# Patient Record
Sex: Male | Born: 1980 | Race: White | Hispanic: No | Smoking: Current every day smoker
Health system: Southern US, Community
[De-identification: ages and names within clinical notes are randomized; demographics above are authoritative.]

## PROBLEM LIST (undated history)

## (undated) DIAGNOSIS — F32A Depression, unspecified: Secondary | ICD-10-CM

## (undated) DIAGNOSIS — F329 Major depressive disorder, single episode, unspecified: Secondary | ICD-10-CM

## (undated) DIAGNOSIS — K589 Irritable bowel syndrome without diarrhea: Secondary | ICD-10-CM

## (undated) DIAGNOSIS — F419 Anxiety disorder, unspecified: Secondary | ICD-10-CM

## (undated) HISTORY — DX: Major depressive disorder, single episode, unspecified: F32.9

## (undated) HISTORY — DX: Irritable bowel syndrome, unspecified: K58.9

## (undated) HISTORY — DX: Anxiety disorder, unspecified: F41.9

## (undated) HISTORY — DX: Depression, unspecified: F32.A

---

## 2008-07-23 ENCOUNTER — Ambulatory Visit: Payer: Self-pay | Admitting: Internal Medicine

## 2009-02-16 ENCOUNTER — Emergency Department: Payer: Self-pay | Admitting: Emergency Medicine

## 2009-02-20 ENCOUNTER — Emergency Department: Payer: Self-pay | Admitting: Emergency Medicine

## 2009-02-22 ENCOUNTER — Ambulatory Visit: Payer: Self-pay | Admitting: Anesthesiology

## 2016-05-31 ENCOUNTER — Ambulatory Visit: Payer: Self-pay | Admitting: Family Medicine

## 2016-06-12 ENCOUNTER — Encounter: Payer: Self-pay | Admitting: Family Medicine

## 2016-06-12 ENCOUNTER — Telehealth: Payer: Self-pay | Admitting: Family Medicine

## 2016-06-12 ENCOUNTER — Ambulatory Visit (INDEPENDENT_AMBULATORY_CARE_PROVIDER_SITE_OTHER): Payer: Managed Care, Other (non HMO) | Admitting: Family Medicine

## 2016-06-12 ENCOUNTER — Ambulatory Visit
Admission: RE | Admit: 2016-06-12 | Discharge: 2016-06-12 | Disposition: A | Discharge status: Home / Self Care | Payer: Managed Care, Other (non HMO) | Source: Ambulatory Visit | Attending: Family Medicine | Admitting: Family Medicine

## 2016-06-12 VITALS — BP 128/72 | HR 106 | Temp 99.1°F | Ht 72.0 in | Wt 160.8 lb

## 2016-06-12 DIAGNOSIS — F419 Anxiety disorder, unspecified: Secondary | ICD-10-CM | POA: Diagnosis not present

## 2016-06-12 DIAGNOSIS — S0990XA Unspecified injury of head, initial encounter: Secondary | ICD-10-CM

## 2016-06-12 DIAGNOSIS — F1910 Other psychoactive substance abuse, uncomplicated: Secondary | ICD-10-CM | POA: Diagnosis not present

## 2016-06-12 DIAGNOSIS — F32A Depression, unspecified: Secondary | ICD-10-CM | POA: Insufficient documentation

## 2016-06-12 DIAGNOSIS — K589 Irritable bowel syndrome without diarrhea: Secondary | ICD-10-CM | POA: Insufficient documentation

## 2016-06-12 DIAGNOSIS — S0003XA Contusion of scalp, initial encounter: Secondary | ICD-10-CM | POA: Diagnosis not present

## 2016-06-12 DIAGNOSIS — F329 Major depressive disorder, single episode, unspecified: Secondary | ICD-10-CM | POA: Diagnosis not present

## 2016-06-12 DIAGNOSIS — K580 Irritable bowel syndrome with diarrhea: Secondary | ICD-10-CM

## 2016-06-12 DIAGNOSIS — X58XXXA Exposure to other specified factors, initial encounter: Secondary | ICD-10-CM | POA: Insufficient documentation

## 2016-06-12 DIAGNOSIS — F1911 Other psychoactive substance abuse, in remission: Secondary | ICD-10-CM | POA: Insufficient documentation

## 2016-06-12 MED ORDER — SERTRALINE HCL 50 MG PO TABS: 25.0000 mg | ORAL_TABLET | Freq: Every day | ORAL | 3 refills | Status: DC

## 2016-06-12 NOTE — Assessment & Plan Note (Signed)
Patient is abusing inhalants. We will evaluate his head injury first and then we'll need to discuss potential treatment for this issue.

## 2016-06-12 NOTE — Patient Instructions (Signed)
Nice to see you. We'll start you on Zoloft for depression and anxiety. If you develop thoughts of harming yourself you need to seek medical attention immediately. We'll try and get a CT scan of your head today. We will contact you with the results. If you develop headaches, drowsiness, vision changes, numbness or weakness, or any new or changing symptoms please seek medical attention medially.

## 2016-06-12 NOTE — Assessment & Plan Note (Signed)
Diarrhea predominant. Advised to continue to monitor his diet. Monitor for progression of symptoms. Zoloft may be beneficial.

## 2016-06-12 NOTE — Telephone Encounter (Signed)
Spoke with patient regarding CT scan results. No skull fractures noted. No intracranial abnormalities. There were 2 hematomas noted within his scalp. Discussed that this may take several weeks to a month or so to resolve completely. Also discussed whether or not he wanted any help with his inhalant use. He notes he has good support in place and is starting to get a handle on it. He'll let us know if he desires any help from [place].

## 2016-06-12 NOTE — Progress Notes (Signed)
Marikay Alar, MD Phone: [phone]  Alexander Kramer is a 36 y.o. male who presents today for new patient visit.  Depression/anxiety: Patient notes he recently underwent a separation from his wife. She cheated on him and he found some fairly disturbing messages. Had to leave the home. He is staying with a friend and is now staying with his mother. Notes his sleep was disturbed initially though he is sleeping better. He just feels worthless. He notes no SI. He has a therapy appointment next week and did 3 therapy visits through his work. In the past he's been on Wellbutrin and possibly Prozac though both of these made him jittery.  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 It's not as bad as it used to be. He's made some dietary changes. It really depends on what he eats. No blood in his stool. Rare nausea. Rare constipation.  Patient notes Friday of last week he was bent over at work and was startled by a customer and hit the right anterior portion of his head on his desk. He had a knot in this area and then over the next 2-3 days developed significant bruising around his eyes bilaterally right greater than left. He reports there was no loss of consciousness. He has not had any headaches, vision changes, numbness, or weakness since then. He also reports he has been abusing inhalants and is unable to remember anything when he does this. On several occasions he has reportedly fallen and hit his head and does not remember anything from this. His mother describes a possible drawing of the left side of his face after using this. They note several times he has hit his head when falling after using inhalants. He denies any drainage from his nose.  Active Ambulatory Problems    Diagnosis Date Noted  . Anxiety and depression 06/12/2016  . Head injury 06/12/2016  . IBS (irritable bowel syndrome) 06/12/2016  . Drug abuse 06/12/2016   Resolved Ambulatory Problems    Diagnosis Date Noted  . No  Resolved Ambulatory Problems   Past Medical History:  Diagnosis Date  . Anxiety   . Depression   . IBS (irritable bowel syndrome)     Family History  Problem Relation Age of Onset  . Depression Father   . Depression Sister   . Prostate cancer Paternal Uncle   . Prostate cancer Paternal Grandfather   . Depression Sister     Social History   Social History  . Marital status: Single    Spouse name: N/A  . Number of children: N/A  . Years of education: N/A   Occupational History  . Not on file.   Social History Main Topics  . Smoking status: Current Every Day Smoker  . Smokeless tobacco: Never Used  . Alcohol use No  . Drug use: Yes  . Sexual activity: Not on file   Other Topics Concern  . Not on file   Social History Narrative  . No narrative on file    ROS  General:  Negative for nexplained weight loss, fever Skin: Negative for new or changing mole, sore that won't heal HEENT: Negative for trouble hearing, trouble seeing, ringing in ears, mouth sores, hoarseness, change in voice, dysphagia. CV:  Negative for chest pain, dyspnea, edema, palpitations Resp: Negative for cough, dyspnea, hemoptysis GI: Positive for nausea, diarrhea Negative for vomiting, constipation, abdominal pain, melena, hematochezia. GU: Negative for dysuria, incontinence, urinary hesitance, hematuria, vaginal or penile discharge, polyuria, sexual difficulty, lumps  in testicle or breasts MSK: Negative for muscle cramps or aches, joint pain or swelling Neuro: Negative for headaches, weakness, numbness, dizziness, passing out/fainting Psych: Positive for depression, anxiety, negative for memory problems  Objective  Physical Exam Vitals:   06/12/16 1542  BP: 128/72  Pulse: (!) 106  Temp: 99.1 F (37.3 C)    BP Readings from Last 3 Encounters:  06/12/16 128/72   Wt Readings from Last 3 Encounters:  06/12/16 160 lb 12.8 oz (72.9 kg)    Physical Exam  Constitutional: No distress.    HENT:  Head:    Mouth/Throat: Oropharynx is clear and moist. No oropharyngeal exudate.  Patient with ecchymoses under both eyes right greater than left, he has preauricular and posterior auricular ecchymoses as well, nontender in all of these areas, no bony step off of the brow or forehead, no bony step off of the eye sockets or nose, no tenderness of the postauricular area, no hemotympanum  Eyes: EOM are normal. Pupils are equal, round, and reactive to light.  Small right eye lateral subconjunctival hemorrhage noted  Neck: Neck supple.  Cardiovascular: Normal rate, regular rhythm and normal heart sounds.   Pulmonary/Chest: Effort normal and breath sounds normal.  Abdominal: Soft. Bowel sounds are normal. He exhibits no distension. There is no tenderness. There is no rebound and no guarding.  Musculoskeletal: He exhibits no edema.  Lymphadenopathy:    He has no cervical adenopathy.  Neurological: He is alert. Gait normal.  Skin: Skin is warm and dry. He is not diaphoretic.  Psychiatric:  Mood depressed, affect flat     Assessment/Plan:   Anxiety and depression Patient with significant anxiety and depression. Somewhat reactive to recent separation and difficulties with his wife. Discussed that he would benefit from therapy and he should proceed with that. Discussed medication options and we will try Zoloft. Given that he has developed some jitteriness with other SSRIs we will start at the lowest dose of 25 mg. We'll see him back in 4 weeks. Given return precautions.  Head injury Patient with head injury about 5 days ago and possible injuries since then with falling after inhalant use. Concern given findings of postauricular ecchymosis and periorbital ecchymosis would be for skull fracture. Given this and his repeated falls we will obtain a CT scan of his head to evaluate this. Discussed that if he does have a skull fracture he would likely need to go to the emergency room for evaluation.  Discussed that if he develops any headache, vision changes, numbness, weakness, nausea, vomiting, or any new symptoms he should go to the emergency room for evaluation as well.  IBS (irritable bowel syndrome) Diarrhea predominant. Advised to continue to monitor his diet. Monitor for progression of symptoms. Zoloft may be beneficial.  Drug abuse Patient is abusing inhalants. We will evaluate his head injury first and then we'll need to discuss potential treatment for this issue.   Orders Placed This Encounter  Procedures  . CT Head Wo Contrast    Standing Status:   Future    Number of Occurrences:   1    Standing Expiration Date:   09/12/2017    Order Specific Question:   Reason for Exam (SYMPTOM  OR DIAGNOSIS REQUIRED)    Answer:   head injury friday, multiple falls since then, large hematoma right forehead, ecchymosis postauricular area    Order Specific Question:   Preferred imaging location?    Answer:   [facility]    Order Specific Question:  Call Results- Best Contact Number?    Answer:   [phone], hold pt    Order Specific Question:   Radiology Contrast Protocol - do NOT remove file path    Answer:   [email or web address]    Marikay Alar, MD [facility] Primary [facility]

## 2016-06-12 NOTE — Assessment & Plan Note (Signed)
Patient with significant anxiety and depression. Somewhat reactive to recent separation and difficulties with his wife. Discussed that he would benefit from therapy and he should proceed with that. Discussed medication options and we will try Zoloft. Given that he has developed some jitteriness with other SSRIs we will start at the lowest dose of 25 mg. We'll see him back in 4 weeks. Given return precautions.

## 2016-06-12 NOTE — Progress Notes (Signed)
Pre visit review using our clinic review tool, if applicable. No additional management support is needed unless otherwise documented below in the visit note. 

## 2016-06-12 NOTE — Assessment & Plan Note (Signed)
Patient with head injury about 5 days ago and possible injuries since then with falling after inhalant use. Concern given findings of postauricular ecchymosis and periorbital ecchymosis would be for skull fracture. Given this and his repeated falls we will obtain a CT scan of his head to evaluate this. Discussed that if he does have a skull fracture he would likely need to go to the emergency room for evaluation. Discussed that if he develops any headache, vision changes, numbness, weakness, nausea, vomiting, or any new symptoms he should go to the emergency room for evaluation as well.

## 2016-06-19 ENCOUNTER — Telehealth: Payer: Self-pay | Admitting: Family Medicine

## 2016-06-19 NOTE — Telephone Encounter (Signed)
Pt dropped off disability form to be filled out.. Placed in Dr. Birdie SonsSonnenberg color folder upfront. Please advise

## 2016-06-21 NOTE — Telephone Encounter (Signed)
In Dr.Sonnenbergs green form folder to fill out 

## 2016-06-25 NOTE — Telephone Encounter (Signed)
Please advise 

## 2016-06-25 NOTE — Telephone Encounter (Signed)
Pt called to follow up on the paper work he dropped off. Thank you!  Call pt @ [phone].

## 2016-07-03 NOTE — Telephone Encounter (Signed)
Form completed. Please fill in physician information and then I will sign. Thanks.

## 2016-07-04 DIAGNOSIS — Z7689 Persons encountering health services in other specified circumstances: Secondary | ICD-10-CM

## 2016-07-04 NOTE — Telephone Encounter (Signed)
Patient notified form will be faxed today, patient also requested a copy to be faxed to him as well

## 2016-07-05 ENCOUNTER — Ambulatory Visit: Payer: Self-pay | Admitting: Family Medicine

## 2016-07-15 ENCOUNTER — Emergency Department
Admission: EM | Admit: 2016-07-15 | Discharge: 2016-07-15 | Disposition: A | Discharge status: Home / Self Care | Payer: Managed Care, Other (non HMO) | Attending: Emergency Medicine | Admitting: Emergency Medicine

## 2016-07-15 ENCOUNTER — Encounter: Payer: Self-pay | Admitting: *Deleted

## 2016-07-15 DIAGNOSIS — F17200 Nicotine dependence, unspecified, uncomplicated: Secondary | ICD-10-CM | POA: Insufficient documentation

## 2016-07-15 DIAGNOSIS — F1910 Other psychoactive substance abuse, uncomplicated: Secondary | ICD-10-CM | POA: Insufficient documentation

## 2016-07-15 DIAGNOSIS — F1810 Inhalant abuse, uncomplicated: Secondary | ICD-10-CM | POA: Diagnosis present

## 2016-07-15 NOTE — ED Notes (Signed)

## 2016-07-15 NOTE — ED Provider Notes (Signed)
[facility] Emergency Department Provider Note  Time seen: 12:22 PM  I have reviewed the triage vital signs and the nursing notes.   HISTORY  Chief Complaint Toxic Inhalation    HPI Alexander JewelBobby J Munce is a 36 y.o. male with a past medical history of anxiety, depression, presents to the emergency department after being found huffing an air duster.According to the patient he wanted to get a "buzz" this morning so he used an air duster. Patient states he has done this to her 3 times in the past. He states today he locked his office at work attempted to use the air duster to get high and fell asleep. The patient does admit some depression has a therapist which he sees. But denies any SI or HI. Denies any attempt to harm himself. He states he just did too much and must fall asleep so he could not answer the door. Patient states he has done this in the past. Denies any other substances used.  Past Medical History:  Diagnosis Date  . Anxiety   . Depression   . IBS (irritable bowel syndrome)     Patient Active Problem List   Diagnosis Date Noted  . Anxiety and depression 06/12/2016  . Head injury 06/12/2016  . IBS (irritable bowel syndrome) 06/12/2016  . Drug abuse 06/12/2016    History reviewed. No pertinent surgical history.  Prior to Admission medications   Medication Sig Start Date End Date Taking? Authorizing Provider  ibuprofen (ADVIL,MOTRIN) 200 MG tablet Take 200 mg by mouth every 6 (six) hours as needed.    [provider]  sertraline (ZOLOFT) 50 MG tablet Take 0.5 tablets (25 mg total) by mouth daily. 06/12/16   Glori LuisSonnenberg, Eric G, MD    No Known Allergies  Family History  Problem Relation Age of Onset  . Depression Father   . Depression Sister   . Prostate cancer Paternal Uncle   . Prostate cancer Paternal Grandfather   . Depression Sister     Social History Social History  Substance Use Topics  . Smoking status: Current Every Day  Smoker  . Smokeless tobacco: Never Used  . Alcohol use No    Review of Systems Constitutional: Negative for fever. Eyes: Negative for visual changes. ENT: Negative for congestion Cardiovascular: Negative for chest pain. Respiratory: Negative for shortness of breath. Gastrointestinal: Negative for abdominal pain Musculoskeletal: Negative for back pain. Neurological: Negative for headache All other ROS negative  ____________________________________________   PHYSICAL EXAM:  VITAL SIGNS: ED Triage Vitals [07/15/16 1123]  Enc Vitals Group     BP (!) 144/92     Pulse Rate (!) 114     Resp 16     Temp 97.8 F (36.6 C)     Temp Source Oral     SpO2 96 %     Weight 160 lb (72.6 kg)     Height 6' (1.829 m)     Head Circumference      Peak Flow      Pain Score      Pain Loc      Pain Edu?      Excl. in GC?     Constitutional: Alert and oriented. Well appearing and in no distress. Eyes: Normal exam ENT   Head: Normocephalic and atraumatic.   Mouth/Throat: Mucous membranes are moist. Cardiovascular: Normal rate, regular rhythm. No murmur Respiratory: Normal respiratory effort without tachypnea nor retractions. Breath sounds are clear Gastrointestinal: Soft and nontender. No distention.  Musculoskeletal: Nontender with normal range of motion in all extremities.  Neurologic:  Normal speech and language. No gross focal neurologic deficits Skin:  Skin is warm, dry and intact.  Psychiatric: Mood and affect are normal.   ____________________________________________   INITIAL IMPRESSION / ASSESSMENT AND PLAN / ED COURSE  Pertinent labs & imaging results that were available during my care of the patient were reviewed by me and considered in my medical decision making (see chart for details).  Patient presents under IVC after being found huffing an air duster. Patient openly admits to this. Denies any other substances. Denies any attempt to hurt himself. Denies any SI  or HI. At this time I did not feel the patient meets IVC criteria. He appears to have been very forthcoming. He has outpatient therapy for depression, takes Zoloft. Denies any thoughts of hurting himself or anybody else. We will terminate the IVC and release the patient from the emergency department.  ____________________________________________   FINAL CLINICAL IMPRESSION(S) / ED DIAGNOSES  Substance abuse    Minna Antis, MD 07/15/16 1225

## 2016-07-15 NOTE — ED Notes (Signed)
FN: pt here with [employer] under IVC. Pt cooperative at this time.

## 2016-07-15 NOTE — ED Triage Notes (Signed)
Pt states he was found huffing air duster, pt was found in office bent over with 3 empty cans of inhaler talking nonsense, [employer] states they had to force entry due to pt not responding, states he has done this 3 times with hopes of getting high, denies SI or HI, denies any other drug or ETOH abuse, awake and alert at present

## 2016-07-15 NOTE — Discharge Instructions (Signed)
You have been seen in the emergency department for a  psychiatric concern. Please follow-up with your outpatient resources/therapist. Return to the emergency department for any worsening symptoms, or any thoughts of hurting yourself or anyone else so that we may attempt to help you.

## 2016-07-23 ENCOUNTER — Encounter: Payer: Self-pay | Admitting: Family Medicine

## 2016-07-23 ENCOUNTER — Ambulatory Visit (INDEPENDENT_AMBULATORY_CARE_PROVIDER_SITE_OTHER): Payer: Managed Care, Other (non HMO) | Admitting: Family Medicine

## 2016-07-23 ENCOUNTER — Encounter (INDEPENDENT_AMBULATORY_CARE_PROVIDER_SITE_OTHER): Payer: Self-pay

## 2016-07-23 DIAGNOSIS — F419 Anxiety disorder, unspecified: Secondary | ICD-10-CM | POA: Diagnosis not present

## 2016-07-23 DIAGNOSIS — F329 Major depressive disorder, single episode, unspecified: Secondary | ICD-10-CM

## 2016-07-23 DIAGNOSIS — F32A Depression, unspecified: Secondary | ICD-10-CM

## 2016-07-23 DIAGNOSIS — F1910 Other psychoactive substance abuse, uncomplicated: Secondary | ICD-10-CM

## 2016-07-23 MED ORDER — SERTRALINE HCL 100 MG PO TABS: 100.0000 mg | ORAL_TABLET | Freq: Every day | ORAL | 1 refills | Status: DC

## 2016-07-23 NOTE — Assessment & Plan Note (Signed)
Depression improved. Anxiety stable. He is interested in going up on Zoloft. He'll complete his current prescription and then increase to 100 mg daily. He'll start seeing the addiction specialist as planned. Given return precautions. See him back in 2 months.

## 2016-07-23 NOTE — Assessment & Plan Note (Signed)
Has not used any inhalants in the last 8 days. He reports he will never use them again. He reports he hit rock bottom when he was found in his office after having used the inhalant. He is going to be seeing an addiction specialist.

## 2016-07-23 NOTE — Progress Notes (Signed)
  Marikay AlarEric Aylen Stradford, MD Phone: [phone]  Antonietta JewelBobby J Kramer is a 36 y.o. male who presents today for follow-up.  Anxiety/depression: Patient notes that his depression is not as bad as it was. The anxiety is about the same. Sometimes it does hit him hard. Mostly surrounding not being able see his son as much as he would like. He's moved to where he has accepted the separation from his wife as not being so bad. He is seeing a therapist which is up significantly. They are going to switch him to seeing a drug addiction specialist. He notes no SI or HI. He did use the inhalant 2 times since we last saw each other and on the second occasion used it at work and ended up in the emergency room after he fell asleep in his office. His job is under investigation through HR. He is still waiting to find out a determination on that. He is prepping a backup plan by looking for another job.  ROS see history of present illness  Objective  Physical Exam Vitals:   07/23/16 1559  BP: 114/86  Pulse: 97  Temp: 98.8 F (37.1 C)    BP Readings from Last 3 Encounters:  07/23/16 114/86  07/15/16 (!) 142/90  06/12/16 128/72   Wt Readings from Last 3 Encounters:  07/23/16 156 lb 6.4 oz (70.9 kg)  07/15/16 160 lb (72.6 kg)  06/12/16 160 lb 12.8 oz (72.9 kg)    Physical Exam  Constitutional: He is well-developed, well-nourished, and in no distress.  Cardiovascular: Normal rate, regular rhythm and normal heart sounds.   Pulmonary/Chest: Effort normal and breath sounds normal.  Musculoskeletal: He exhibits no edema.  Neurological: He is alert. Gait normal.     Assessment/Plan: Please see individual problem list.  Anxiety and depression Depression improved. Anxiety stable. He is interested in going up on Zoloft. He'll complete his current prescription and then increase to 100 mg daily. He'll start seeing the addiction specialist as planned. Given return precautions. See him back in 2 months.  Drug  abuse Has not used any inhalants in the last 8 days. He reports he will never use them again. He reports he hit rock bottom when he was found in his office after having used the inhalant. He is going to be seeing an addiction specialist.   No orders of the defined types were placed in this encounter.   Meds ordered this encounter  Medications  . sertraline (ZOLOFT) 100 MG tablet    Sig: Take 1 tablet (100 mg total) by mouth daily.    Dispense:  90 tablet    Refill:  1   Marikay AlarEric Romonia Yanik, MD [facility] [place]

## 2016-07-23 NOTE — Patient Instructions (Signed)
Nice to see you. We will increase your Zoloft to 100 mg daily. We'll see you back in 2 months. If you develop thoughts of harming yourself please seek medical attention immediately.

## 2016-09-24 ENCOUNTER — Encounter: Payer: Self-pay | Admitting: Family Medicine

## 2016-09-24 ENCOUNTER — Ambulatory Visit (INDEPENDENT_AMBULATORY_CARE_PROVIDER_SITE_OTHER): Payer: Managed Care, Other (non HMO) | Admitting: Family Medicine

## 2016-09-24 VITALS — BP 98/70 | HR 79 | Temp 98.4°F | Wt 171.4 lb

## 2016-09-24 DIAGNOSIS — F329 Major depressive disorder, single episode, unspecified: Secondary | ICD-10-CM

## 2016-09-24 DIAGNOSIS — F32A Depression, unspecified: Secondary | ICD-10-CM

## 2016-09-24 DIAGNOSIS — F419 Anxiety disorder, unspecified: Secondary | ICD-10-CM | POA: Diagnosis not present

## 2016-09-24 DIAGNOSIS — L988 Other specified disorders of the skin and subcutaneous tissue: Secondary | ICD-10-CM | POA: Diagnosis not present

## 2016-09-24 DIAGNOSIS — F1910 Other psychoactive substance abuse, uncomplicated: Secondary | ICD-10-CM

## 2016-09-24 DIAGNOSIS — R21 Rash and other nonspecific skin eruption: Secondary | ICD-10-CM | POA: Diagnosis not present

## 2016-09-24 DIAGNOSIS — Z720 Tobacco use: Secondary | ICD-10-CM

## 2016-09-24 MED ORDER — BUPROPION HCL ER (SR) 150 MG PO TB12: ORAL_TABLET | ORAL | 3 refills | Status: DC

## 2016-09-24 NOTE — Assessment & Plan Note (Signed)
Discussed nicotine replacement versus Wellbutrin. Patient is willing to try Wellbutrin again. Discussed minimal risk for serotonin syndrome and signs to look for. Discussed picking a quit date and 1-2 weeks prior to that starting the Wellbutrin. Follow-up in 3 months.

## 2016-09-24 NOTE — Assessment & Plan Note (Signed)
Discussed that I am not sure what the cause of the macule is. Refer to dermatology.

## 2016-09-24 NOTE — Patient Instructions (Signed)
We will start you on Wellbutrin for tobacco cessation. Please pick a quit date and start the Wellbutrin 1-2 weeks prior to this. Please continue the Zoloft and continue to see the therapist. Alexander LabradorWe'll get you see dermatology.

## 2016-09-24 NOTE — Progress Notes (Signed)
  Marikay AlarEric Manly Nestle, MD Phone: [phone]  Antonietta JewelBobby J Kramer is a 36 y.o. male who presents today for follow-up.  Depression/anxiety: Patient notes depression is well controlled. Some anxiety that is much better. Currently on Zoloft. Seeing a therapist. No SI or HI.  He's been seeing a therapist for addiction. He had been using inhalants. He has had no urge to use them in the last 2 months. He has not used in the last 2 months.  Patient notes a hyperpigmented macule on his left shoulder. It's been there about a month and a half. Does not itch. It does not hurt. It has not changed.  Tobacco abuse: Currently smoking 2 packs per day. He was previously on Wellbutrin and notes this did help. He is trying nicotine replacement in the past with little benefit.  PMH: Smoker   ROS see history of present illness  Objective  Physical Exam Vitals:   09/24/16 1354  BP: 98/70  Pulse: 79  Temp: 98.4 F (36.9 C)  SpO2: 97%    BP Readings from Last 3 Encounters:  09/24/16 98/70  07/23/16 114/86  07/15/16 (!) 142/90   Wt Readings from Last 3 Encounters:  09/24/16 171 lb 6.4 oz (77.7 kg)  07/23/16 156 lb 6.4 oz (70.9 kg)  07/15/16 160 lb (72.6 kg)    Physical Exam  Constitutional: No distress.  Cardiovascular: Normal rate, regular rhythm and normal heart sounds.   Pulmonary/Chest: Effort normal and breath sounds normal.  Neurological: He is alert. Gait normal.  Skin: He is not diaphoretic.  Hyperpigmented skin macule on left shoulder, no tenderness, no induration, no erythema     Assessment/Plan: Please see individual problem list.  Anxiety and depression Much improved. Continue Zoloft. Continue to see the therapist. Follow-up in 3 months.  Drug abuse No use in the last 2 months. He'll continue to see the addiction therapist.  Skin macule Discussed that I am not sure what the cause of the macule is. Refer to dermatology.  Tobacco abuse Discussed nicotine replacement versus  Wellbutrin. Patient is willing to try Wellbutrin again. Discussed minimal risk for serotonin syndrome and signs to look for. Discussed picking a quit date and 1-2 weeks prior to that starting the Wellbutrin. Follow-up in 3 months.   Orders Placed This Encounter  Procedures  . Ambulatory referral to Dermatology    Referral Priority:   Routine    Referral Type:   Consultation    Referral Reason:   Specialty Services Required    Requested Specialty:   Dermatology    Number of Visits Requested:   1    Meds ordered this encounter  Medications  . buPROPion (WELLBUTRIN SR) 150 MG 12 hr tablet    Sig: Take 150 mg (one tablet) by mouth daily for 3 days, then take 150 mg (one tablet) by mouth twice daily    Dispense:  60 tablet    Refill:  3    Marikay AlarEric Lakecia Deschamps, MD [facility] [place]

## 2016-09-24 NOTE — Assessment & Plan Note (Signed)
Much improved. Continue Zoloft. Continue to see the therapist. Follow-up in 3 months.

## 2016-09-24 NOTE — Assessment & Plan Note (Signed)
No use in the last 2 months. He'll continue to see the addiction therapist.

## 2016-12-16 ENCOUNTER — Ambulatory Visit: Payer: Managed Care, Other (non HMO) | Admitting: Family Medicine

## 2016-12-16 DIAGNOSIS — Z0289 Encounter for other administrative examinations: Secondary | ICD-10-CM

## 2017-01-23 ENCOUNTER — Telehealth: Payer: Self-pay

## 2017-01-23 NOTE — Telephone Encounter (Signed)
Left message to call back. Please find out more information to determine if this is urgent or not and triage If needed.

## 2017-01-23 NOTE — Telephone Encounter (Signed)
Copied from CRM #[id number]. Topic: Inquiry >> Jan 23, 2017 12:42 PM Alexander BergeronBarksdale, Harvey B wrote: Reason for CRM: contact pt b/c he has a medical concern that needs to be addressed and information that needs to be transferred, contact pt

## 2017-02-26 ENCOUNTER — Telehealth: Payer: Self-pay | Admitting: Family Medicine

## 2017-02-26 ENCOUNTER — Other Ambulatory Visit: Payer: Self-pay

## 2017-02-26 MED ORDER — SERTRALINE HCL 100 MG PO TABS: 100.0000 mg | ORAL_TABLET | Freq: Every day | ORAL | 0 refills | Status: DC

## 2017-02-26 NOTE — Telephone Encounter (Signed)
Copied from CRM [phone]. Topic: Quick Communication - Rx Refill/Question >> Feb 26, 2017 11:05 AM Herby AbrahamJohnson, Shiquita C wrote:    Pt request   Medication: sertraline (ZOLOFT    Has the patient contacted their pharmacy? no   (Agent: If no, request that the patient contact the pharmacy for the refill.)   Preferred Pharmacy (with phone number or street name): [facility] on [address]    Agent: Please be advised that RX refills may take up to 3 business days. We ask that you follow-up with your pharmacy.

## 2017-04-29 ENCOUNTER — Other Ambulatory Visit: Payer: Self-pay | Admitting: Family Medicine

## 2017-06-09 ENCOUNTER — Telehealth: Payer: Self-pay | Admitting: Family Medicine

## 2017-06-09 NOTE — Telephone Encounter (Signed)
Copied from CRM [id number]. Topic: Quick Communication - Rx Refill/Question >> Jun 09, 2017  9:31 AM Raquel Sarna wrote: sertraline (ZOLOFT) 100 MG tablet  Needing refills  [facility] [address], [place] - [address] [place] [address] Phone: [phone] Fax: [phone]

## 2017-06-09 NOTE — Telephone Encounter (Signed)
Rx refill request:  Zoloft      Last filled 04/30/17 #30- needs appointment  LOV: 09/24/16 ( f/u due to return- 12/2016)   PCP: Birdie Sons   Pharmacy: change: [facility]/ [address] / [employer]

## 2017-06-09 NOTE — Telephone Encounter (Signed)
Please advise 

## 2017-06-10 NOTE — Telephone Encounter (Signed)
Denies per provider

## 2017-06-11 ENCOUNTER — Other Ambulatory Visit: Payer: Self-pay | Admitting: Family Medicine

## 2017-06-12 MED ORDER — SERTRALINE HCL 100 MG PO TABS: ORAL_TABLET | ORAL | 0 refills | Status: DC

## 2017-06-12 NOTE — Telephone Encounter (Signed)
Please advise 

## 2017-06-12 NOTE — Telephone Encounter (Signed)
Patient notified and scheduled, sent [facility] to [facility] for 90 days supply

## 2017-06-12 NOTE — Telephone Encounter (Signed)
Patient states he needs a refill, he is doing fine on his medication but he does not have insurance and he is not sure when he will have insurance again

## 2017-06-12 NOTE — Telephone Encounter (Signed)
Pt states he does not have insurance any more and can not come in for an appt. He is not sure why he needs an appt and would like to talk to Dr. Birdie Sons.  Please f/u with pt.

## 2017-06-12 NOTE — Addendum Note (Signed)
Addended by: Inetta Fermo on: 06/12/2017 03:20 PM   Modules accepted: Orders

## 2017-06-12 NOTE — Telephone Encounter (Signed)
Pleaser advise, last OV 09/24/16

## 2017-06-12 NOTE — Telephone Encounter (Signed)
It is ok to refill for 90 days. Please send this to his pharmacy. He will need a follow-up sometime within that timeframe.

## 2017-06-12 NOTE — Telephone Encounter (Signed)
He needs to follow-up with [place] every 6 months for his anxiety and depression to ensure there are no changes and no adverse effects from the medications.

## 2017-08-06 ENCOUNTER — Ambulatory Visit: Payer: 59 | Admitting: Family Medicine

## 2017-10-29 ENCOUNTER — Ambulatory Visit (INDEPENDENT_AMBULATORY_CARE_PROVIDER_SITE_OTHER): Payer: Self-pay | Admitting: Family Medicine

## 2017-10-29 ENCOUNTER — Encounter: Payer: Self-pay | Admitting: Family Medicine

## 2017-10-29 DIAGNOSIS — Z87898 Personal history of other specified conditions: Secondary | ICD-10-CM

## 2017-10-29 DIAGNOSIS — F419 Anxiety disorder, unspecified: Secondary | ICD-10-CM

## 2017-10-29 DIAGNOSIS — F32A Depression, unspecified: Secondary | ICD-10-CM

## 2017-10-29 DIAGNOSIS — F1911 Other psychoactive substance abuse, in remission: Secondary | ICD-10-CM

## 2017-10-29 DIAGNOSIS — F329 Major depressive disorder, single episode, unspecified: Secondary | ICD-10-CM

## 2017-10-29 DIAGNOSIS — Z720 Tobacco use: Secondary | ICD-10-CM

## 2017-10-29 MED ORDER — SERTRALINE HCL 100 MG PO TABS: ORAL_TABLET | ORAL | 1 refills | Status: AC

## 2017-10-29 NOTE — Assessment & Plan Note (Signed)
Doing quite well.  We will refill his Zoloft.  Follow-up in 4 months.

## 2017-10-29 NOTE — Progress Notes (Signed)
  Marikay AlarEric Braylen Denunzio, MD Phone: [phone]  Antonietta JewelBobby J Kramer is a 37 y.o. male who presents today for f/u.  CC: anxiety/depression, tobacco abuse, history of drug abuse  Anxiety/depression: Patient notes he has no anxiety or depression at this time.  The Zoloft has been quite beneficial.  No SI.  He notes no illicit drug use. Tobacco abuse: Notes he is not ready to quit smoking.  Smoking 1-1.5 packs/day.  Social History   Tobacco Use  Smoking Status Current Every Day Smoker  Smokeless Tobacco Never Used     ROS see history of present illness  Objective  Physical Exam Vitals:   10/29/17 1537  BP: 118/68  Pulse: 95  Temp: 98.2 F (36.8 C)  SpO2: 97%    BP Readings from Last 3 Encounters:  10/29/17 118/68  09/24/16 98/70  07/23/16 114/86   Wt Readings from Last 3 Encounters:  10/29/17 200 lb (90.7 kg)  09/24/16 171 lb 6.4 oz (77.7 kg)  07/23/16 156 lb 6.4 oz (70.9 kg)    Physical Exam  Constitutional: No distress.  Cardiovascular: Normal rate, regular rhythm and normal heart sounds.  Pulmonary/Chest: Effort normal and breath sounds normal.  Musculoskeletal: He exhibits no edema.  Neurological: He is alert.  Skin: Skin is warm and dry. He is not diaphoretic.     Assessment/Plan: Please see individual problem list.  Anxiety and depression Doing quite well.  We will refill his Zoloft.  Follow-up in 4 months.  History of drug abuse Reports no recent drug abuse.  Tobacco abuse Not ready to quit smoking.  I encouraged him to quit and he will let us know when he is ready.   Health Maintenance: Patient does not have insurance at this time and defers flu vaccination and tetanus vaccination.  He is about to get a job and will likely have insurance by his next visit.  We will have him schedule for a physical at that time.  No orders of the defined types were placed in this encounter.   Meds ordered this encounter  Medications  . sertraline (ZOLOFT) 100 MG tablet     Sig: TAKE 1 TABLET BY MOUTH ONCE DAILY    Dispense:  90 tablet    Refill:  1     Marikay AlarEric Deloras Reichard, MD [facility] [place]

## 2017-10-29 NOTE — Patient Instructions (Signed)
Nice to see you. I am glad you are doing so well. I refilled your Zoloft. When you are ready for your flu shot and tetanus vaccination please let us know.

## 2017-10-29 NOTE — Assessment & Plan Note (Signed)
Reports no recent drug abuse.

## 2017-10-29 NOTE — Assessment & Plan Note (Signed)
Not ready to quit smoking.  I encouraged him to quit and he will let us know when he is ready.

## 2018-03-02 ENCOUNTER — Ambulatory Visit: Payer: Self-pay | Admitting: Family Medicine

## 2018-03-02 DIAGNOSIS — Z0289 Encounter for other administrative examinations: Secondary | ICD-10-CM

## 2018-04-08 ENCOUNTER — Telehealth: Payer: Self-pay

## 2018-04-08 NOTE — Telephone Encounter (Addendum)
The patient's information has been sent to charge correction to have the fee removed. Patient was notified to call 24 hours prior in order not to incur a no show fee.

## 2018-04-08 NOTE — Telephone Encounter (Signed)
Copied from CRM [id number]. Topic: General - Other >> Apr 08, 2018  8:45 AM Burchel, Abbi R wrote: Reason for CRM:   Pt would like a call back re: no show charge for 03/02/2018. Pt states he called ahead to cancel his appt.    [phone]

## 2018-04-19 IMAGING — CT CT HEAD W/O CM
3 series · 15 of 47 positions shown, 18 images · non-contrast
Comparison: 02/20/2009

CLINICAL DATA: Several falls with head injury, initial encounter

EXAM:
CT HEAD WITHOUT CONTRAST
TECHNIQUE: Contiguous axial images were obtained from the base of the skull
through the vertex without intravenous contrast.

[Series 2: head wo · axial · 0.48mm/px · z∈[+678,+813]mm · 9 of 33 slices shown, 12 images]
[im 3/33  brain]
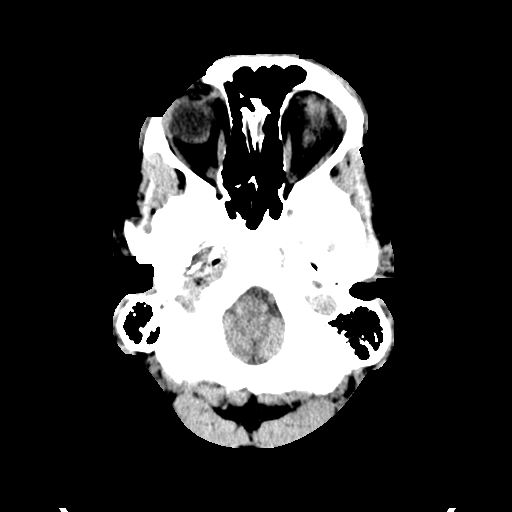
[im 3/33  bone]
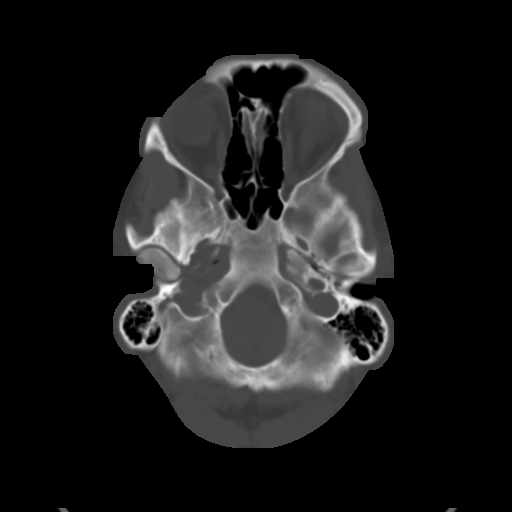
[im 6/33  brain]
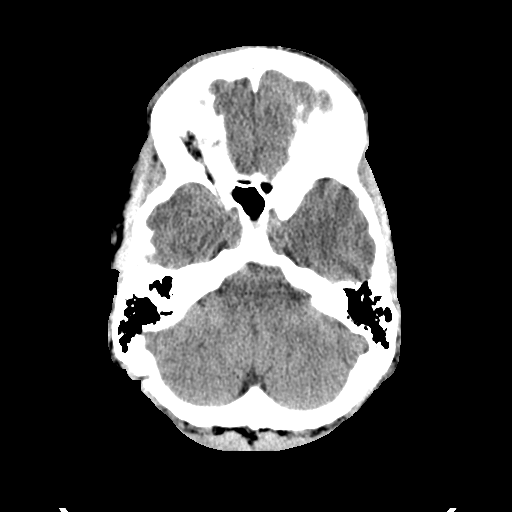
[im 9/33  brain]
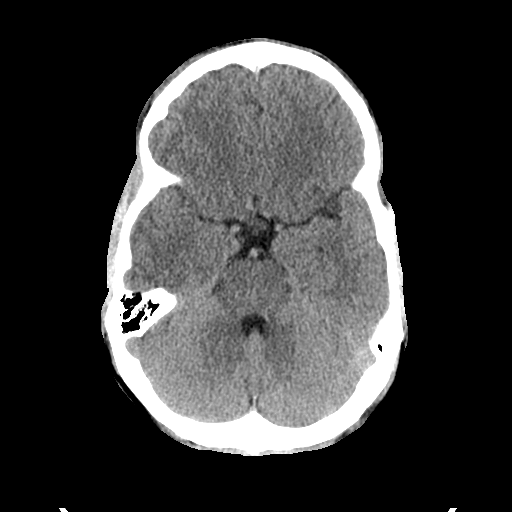
[im 13/33  brain]
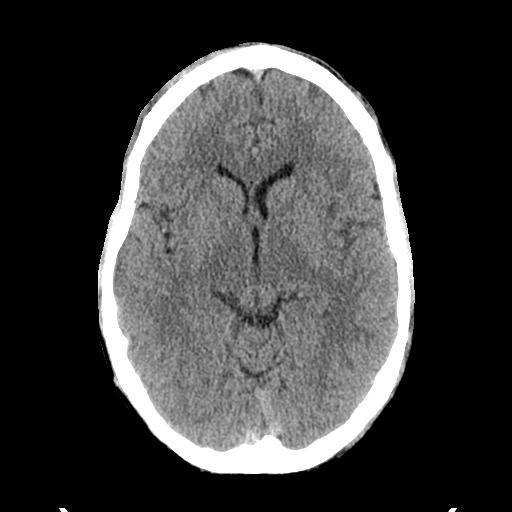
[im 17/33  brain]
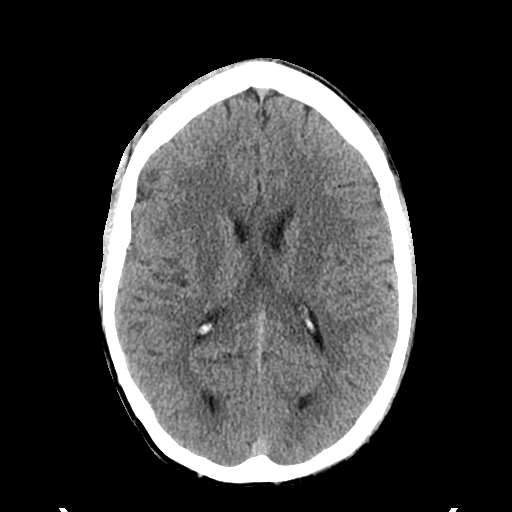
[im 17/33  bone]
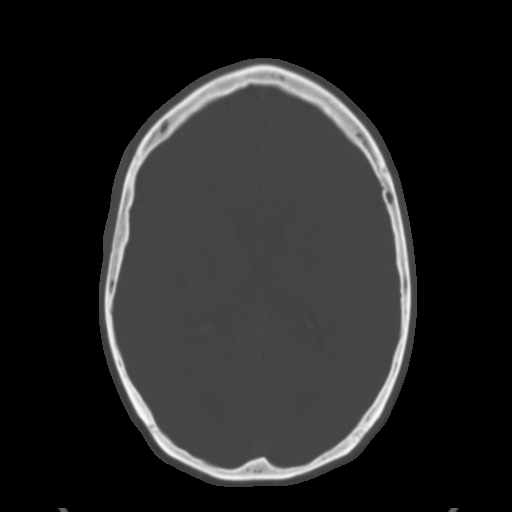
[im 20/33  brain]
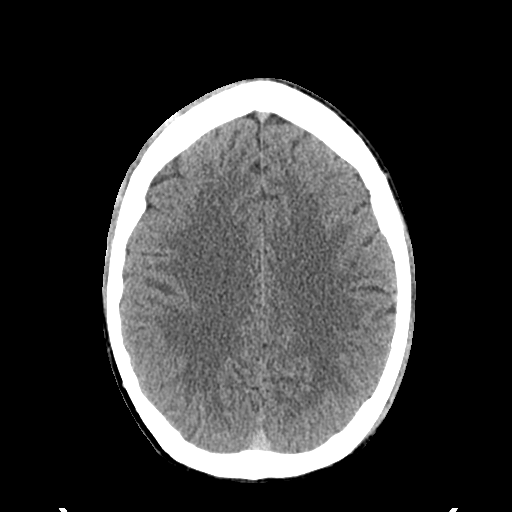
[im 24/33  brain]
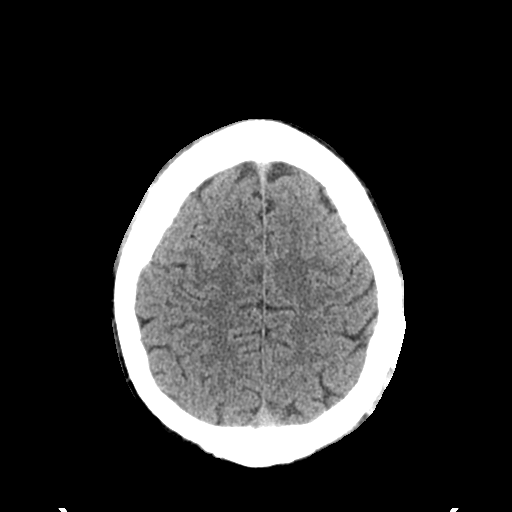
[im 27/33  brain]
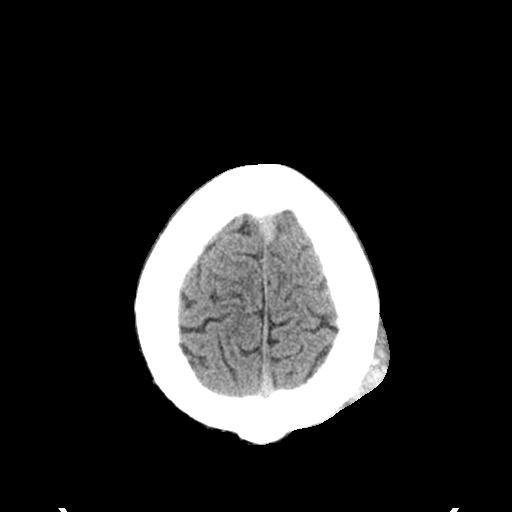
[im 30/33  brain]
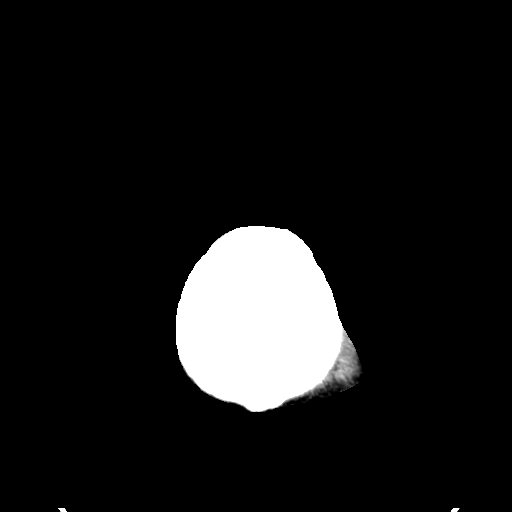
[im 30/33  bone]
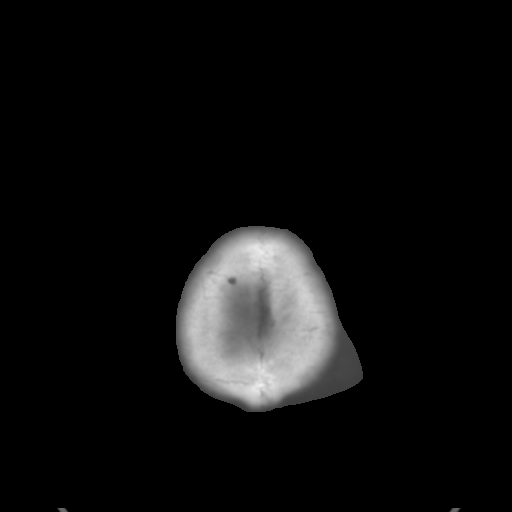

[Series 4: coronal soft tissue · coronal · 0.34mm/px · 3 of 71 slices shown]
[im 24/71  brain]
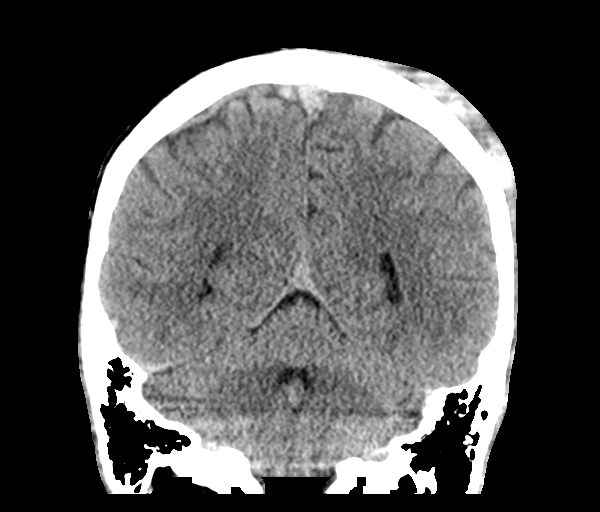
[im 32/71  brain]
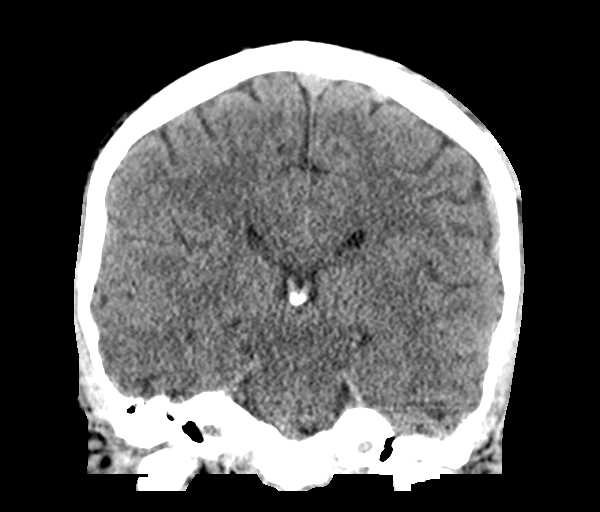
[im 39/71  brain]
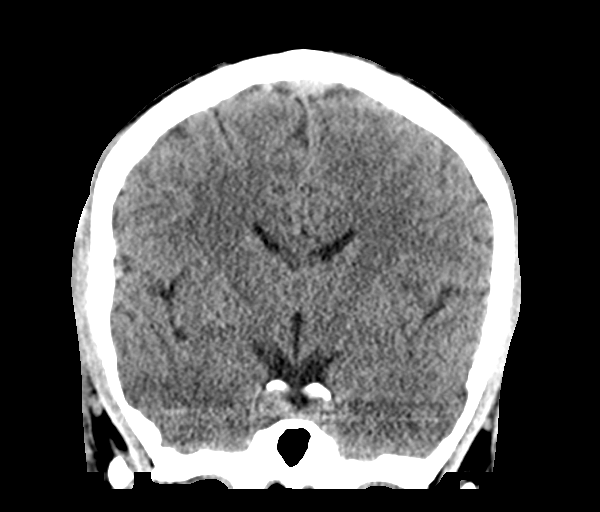

[Series 5: sagittal soft tissue · sagittal · 0.32mm/px · 3 of 55 slices shown]
[im 19/55  brain]
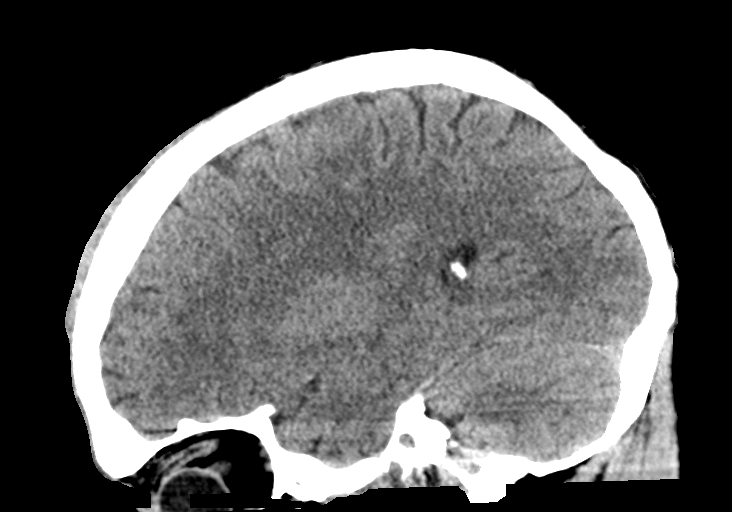
[im 28/55  brain]
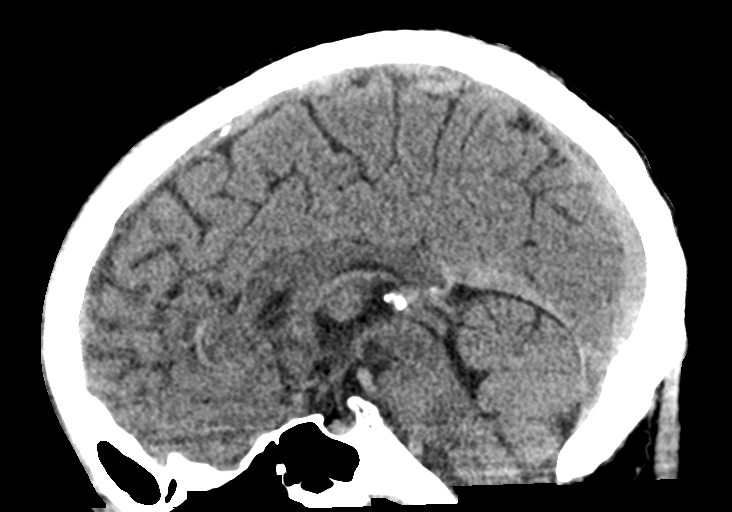
[im 37/55  brain]
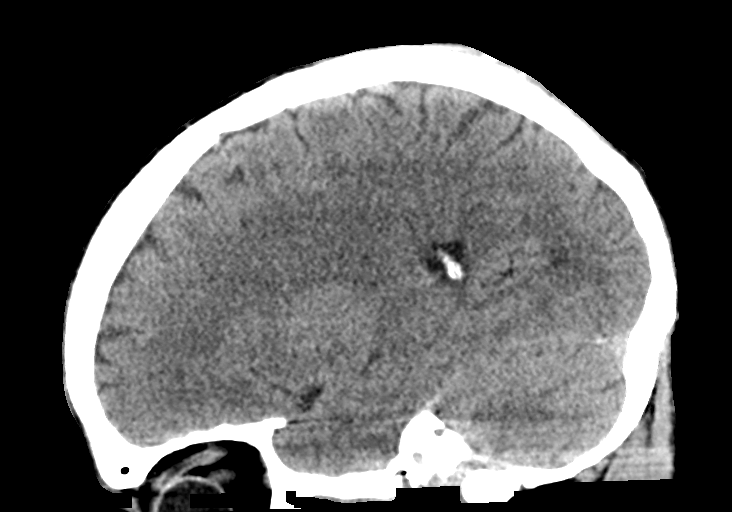

[15 of 47 positions shown; findings below may reference images not displayed]

FINDINGS: Brain: No evidence of acute infarction, hemorrhage, hydrocephalus,
extra-axial collection or mass lesion/mass effect.

Vascular: No hyperdense vessel or unexpected calcification.

Skull: Normal. Negative for fracture or focal lesion.

Sinuses/Orbits: No acute finding.

Other: Scalp hematomas are noted bilaterally near the vertex on the
left over the parietal region and in the frontoparietal region on
the right more inferiorly.
IMPRESSION: Scalp hematomas without acute intracranial abnormality.

## 2019-01-25 ENCOUNTER — Other Ambulatory Visit: Payer: Self-pay

## 2019-01-25 DIAGNOSIS — Z20822 Contact with and (suspected) exposure to covid-19: Secondary | ICD-10-CM

## 2019-01-26 LAB — NOVEL CORONAVIRUS, NAA: SARS-CoV-2, NAA: DETECTED — AB

## 2019-12-06 ENCOUNTER — Telehealth: Payer: Self-pay | Admitting: Emergency Medicine

## 2019-12-06 DIAGNOSIS — J069 Acute upper respiratory infection, unspecified: Secondary | ICD-10-CM

## 2019-12-06 MED ORDER — FLUTICASONE PROPIONATE 50 MCG/ACT NA SUSP: 2.0000 | Freq: Every day | NASAL | 0 refills | Status: DC

## 2019-12-06 MED ORDER — BENZONATATE 100 MG PO CAPS: 100.0000 mg | ORAL_CAPSULE | Freq: Two times a day (BID) | ORAL | 0 refills | Status: DC | PRN

## 2019-12-06 NOTE — Progress Notes (Signed)

## 2020-08-24 ENCOUNTER — Telehealth: Payer: Self-pay | Admitting: Physician Assistant

## 2020-08-24 DIAGNOSIS — A084 Viral intestinal infection, unspecified: Secondary | ICD-10-CM

## 2020-08-24 MED ORDER — ONDANSETRON 4 MG PO TBDP: 4.0000 mg | ORAL_TABLET | Freq: Three times a day (TID) | ORAL | 0 refills | Status: AC | PRN

## 2020-08-24 NOTE — Progress Notes (Signed)
E-Visit for Vomiting  We are sorry that you are not feeling well. Here is how we plan to help!  Based on what you have shared with me it looks like you have a Virus that is irritating your GI tract.  Vomiting is the forceful emptying of a portion of the stomach's content through the mouth.  Although nausea and vomiting can make you feel miserable, it's important to remember that these are not diseases, but rather symptoms of an underlying illness.  When we treat short term symptoms, we always caution that any symptoms that persist should be fully evaluated in a medical office.  I have prescribed a medication that will help alleviate your symptoms and allow you to stay hydrated:  Zofran 4 mg 1 tablet every 8 hours as needed for nausea and vomiting  I imagine all of the retching has caused increase in acid reflux. I recommend taking an OTC Omeprazole (Prilosec) 20 mg daily for up to 2 weeks to let this settle down. Typically this settles down within a few days of the vomiting stopping.   HOME CARE: Drink clear liquids.  This is very important! Dehydration (the lack of fluid) can lead to a serious complication.  Start off with 1 tablespoon every 5 minutes for 8 hours. You may begin eating bland foods after 8 hours without vomiting.  Start with saltine crackers, white bread, rice, mashed potatoes, applesauce. After 48 hours on a bland diet, you may resume a normal diet. Try to go to sleep.  Sleep often empties the stomach and relieves the need to vomit.  GET HELP RIGHT AWAY IF:  Your symptoms do not improve or worsen within 2 days after treatment. You have a fever for over 3 days. You cannot keep down fluids after trying the medication.  MAKE SURE YOU:  Understand these instructions. Will watch your condition. Will get help right away if you are not doing well or get worse.   Thank you for choosing an e-visit.  Your e-visit answers were reviewed by a board certified advanced clinical  practitioner to complete your personal care plan. Depending upon the condition, your plan could have included both over the counter or prescription medications.  Please review your pharmacy choice. Make sure the pharmacy is open so you can pick up prescription now. If there is a problem, you may contact your provider through [employer] and have the prescription routed to another pharmacy.  Your safety is important to [place]. If you have drug allergies check your prescription carefully.   For the next 24 hours you can use MyChart to ask questions about today's visit, request a non-urgent call back, or ask for a work or school excuse. You will get an email in the next two days asking about your experience. I hope that your e-visit has been valuable and will speed your recovery.

## 2020-08-24 NOTE — Progress Notes (Signed)
I have spent 5 minutes in review of e-visit questionnaire, review and updating patient chart, medical decision making and response to patient.   Stevie Charter Cody Madeleine Fenn, PA-C    

## 2021-02-27 ENCOUNTER — Telehealth: Payer: Self-pay | Admitting: Physician Assistant

## 2021-02-27 DIAGNOSIS — J069 Acute upper respiratory infection, unspecified: Secondary | ICD-10-CM

## 2021-02-27 MED ORDER — BENZONATATE 100 MG PO CAPS: 100.0000 mg | ORAL_CAPSULE | Freq: Three times a day (TID) | ORAL | 0 refills | Status: AC | PRN

## 2021-02-27 MED ORDER — FLUTICASONE PROPIONATE 50 MCG/ACT NA SUSP: 2.0000 | Freq: Every day | NASAL | 0 refills | Status: AC

## 2021-02-27 NOTE — Progress Notes (Signed)

## 2021-02-27 NOTE — Progress Notes (Signed)
I have spent 5 minutes in review of e-visit questionnaire, review and updating patient chart, medical decision making and response to patient.   Kamoni Depree Cody Atoya Andrew, PA-C    

## 2024-04-15 ENCOUNTER — Ambulatory Visit: Payer: Self-pay | Admitting: Family Medicine
